# Patient Record
Sex: Female | Born: 1995 | Race: White | Hispanic: No | Marital: Single | Smoking: Never smoker
Health system: Southern US, Community
[De-identification: ages and names within clinical notes are randomized; demographics above are authoritative.]

## PROBLEM LIST (undated history)

## (undated) DIAGNOSIS — G43909 Migraine, unspecified, not intractable, without status migrainosus: Secondary | ICD-10-CM

## (undated) HISTORY — PX: WISDOM TOOTH EXTRACTION: SHX21

## (undated) HISTORY — PX: INNER EAR SURGERY: SHX679

## (undated) HISTORY — DX: Migraine, unspecified, not intractable, without status migrainosus: G43.909

---

## 2019-09-16 ENCOUNTER — Other Ambulatory Visit: Payer: Self-pay

## 2019-09-16 DIAGNOSIS — Z20822 Contact with and (suspected) exposure to covid-19: Secondary | ICD-10-CM

## 2019-09-17 LAB — NOVEL CORONAVIRUS, NAA: SARS-CoV-2, NAA: DETECTED — AB

## 2019-09-26 ENCOUNTER — Ambulatory Visit (HOSPITAL_COMMUNITY)
Admission: EM | Admit: 2019-09-26 | Discharge: 2019-09-26 | Disposition: A | Discharge status: Home / Self Care | Payer: BC Managed Care – PPO | Attending: Radiology | Admitting: Radiology

## 2019-09-26 ENCOUNTER — Ambulatory Visit (INDEPENDENT_AMBULATORY_CARE_PROVIDER_SITE_OTHER): Payer: BC Managed Care – PPO

## 2019-09-26 ENCOUNTER — Encounter (HOSPITAL_COMMUNITY): Payer: Self-pay

## 2019-09-26 ENCOUNTER — Other Ambulatory Visit: Payer: Self-pay

## 2019-09-26 DIAGNOSIS — M791 Myalgia, unspecified site: Secondary | ICD-10-CM

## 2019-09-26 MED ORDER — NAPROXEN 500 MG PO TABS: 500.0000 mg | ORAL_TABLET | Freq: Two times a day (BID) | ORAL | 0 refills | Status: AC

## 2019-09-26 NOTE — ED Triage Notes (Signed)
Pt states she tested positive for Covid 09/13/19 . Pt states she has a tightness in her chest x 2 days.

## 2019-09-26 NOTE — ED Provider Notes (Signed)
[facility]    CSN: [id number] Arrival date & time: 09/26/19  1028      History   Chief Complaint Chief Complaint  Patient presents with  . positive Covid    HPI Jamie Cantu is a 23 y.o. female.  presents with upper sternum "tightness"  X 3 days. Condition is acute in nature. Condition is made better by nothign. Condition is made worse by nothing. Patient denies any treatment prior to there arrival at this facility. Patient states that she is tested covid positive on 11.4.20 with body aches, fevers and cough have elf resolved.    HPI  History reviewed. No pertinent past medical history.  There are no active problems to display for this patient.   History reviewed. No pertinent surgical history.  OB History   No obstetric history on file.      Home Medications    Prior to Admission medications   Not on File    Family History Family History  Problem Relation Age of Onset  . Multiple sclerosis Father     Social History Social History   Tobacco Use  . Smoking status: Never Smoker  . Smokeless tobacco: Never Used  Substance Use Topics  . Alcohol use: Never    Frequency: Never  . Drug use: Not on file     Allergies   Sulfa antibiotics   Review of Systems Review of Systems  Constitutional: Negative for chills and fever.  HENT: Negative for ear pain and sore throat.   Eyes: Negative for pain and visual disturbance.  Respiratory: Negative for cough and shortness of breath.        "Tightness"  Cardiovascular: Negative for chest pain and palpitations.  Gastrointestinal: Negative for abdominal pain and vomiting.  Genitourinary: Negative for dysuria and hematuria.  Musculoskeletal: Negative for arthralgias and back pain.  Skin: Negative for color change and rash.  Neurological: Negative for seizures and syncope.  All other systems reviewed and are negative.    Physical Exam Triage Vital Signs ED Triage Vitals [09/26/19 1115]  Enc  Vitals Group     BP 135/80     Pulse Rate 100     Resp 16     Temp 98.6 F (37 C)     Temp Source Oral     SpO2 100 %     Weight 165 lb (74.8 kg)     Height      Head Circumference      Peak Flow      Pain Score 0     Pain Loc      Pain Edu?      Excl. in GC?    No data found.  Updated Vital Signs BP 135/80 (BP Location: Right Arm)   Pulse 100   Temp 98.6 F (37 C) (Oral)   Resp 16   Wt 165 lb (74.8 kg)   LMP 09/18/2019   SpO2 100%    Physical Exam   UC Treatments / Results  Labs (all labs ordered are listed, but only abnormal results are displayed) Labs Reviewed - No data to display  EKG   Radiology No results found.  Procedures Procedures (including critical care time)  Medications Ordered in UC Medications - No data to display  Initial Impression / Assessment and Plan / UC Course  I have reviewed the triage vital signs and the nursing notes.  Pertinent labs & imaging results that were available during my care of the patient were reviewed by  me and considered in my medical decision making (see chart for details).      Final Clinical Impressions(s) / UC Diagnoses   Final diagnoses:  None   Discharge Instructions   None    ED Prescriptions    None     PDMP not reviewed this encounter.   Jacqualine Mau, NP 09/26/19 1323

## 2020-01-09 ENCOUNTER — Ambulatory Visit: Payer: BC Managed Care – PPO | Attending: Internal Medicine

## 2020-01-09 DIAGNOSIS — Z23 Encounter for immunization: Secondary | ICD-10-CM | POA: Insufficient documentation

## 2020-01-09 NOTE — Progress Notes (Signed)
   Covid-19 Vaccination Clinic  Name:  Serenitie Vinton    MRN: [id number] DOB: May 09, 1996  01/09/2020  Ms. Manukyan was observed post Covid-19 immunization for 15 minutes without incidence. She was provided with Vaccine Information Sheet and instruction to access the V-Safe system.   Ms. Plake was instructed to call 911 with any severe reactions post vaccine: Marland Kitchen Difficulty breathing  . Swelling of your face and throat  . A fast heartbeat  . A bad rash all over your body  . Dizziness and weakness    Immunizations Administered    Name Date Dose VIS Date Route   Pfizer COVID-19 Vaccine 01/09/2020  5:14 PM 0.3 mL 10/23/2019 Intramuscular   Manufacturer: [employer], [employer]   Lot: [id number]   NDC: [address]-0

## 2020-01-30 ENCOUNTER — Ambulatory Visit: Payer: BC Managed Care – PPO | Attending: Internal Medicine

## 2020-01-30 DIAGNOSIS — Z23 Encounter for immunization: Secondary | ICD-10-CM

## 2020-01-30 NOTE — Progress Notes (Signed)
   Covid-19 Vaccination Clinic  Name:  Jamie Cantu    MRN: [id number] DOB: 04-21-1996  01/30/2020  Jamie Cantu was observed post Covid-19 immunization for 15 minutes without incident. She was provided with Vaccine Information Sheet and instruction to access the V-Safe system.   Jamie Cantu was instructed to call 911 with any severe reactions post vaccine: Marland Kitchen Difficulty breathing  . Swelling of face and throat  . A fast heartbeat  . A bad rash all over body  . Dizziness and weakness   Immunizations Administered    Name Date Dose VIS Date Route   Pfizer COVID-19 Vaccine 01/30/2020  9:14 AM 0.3 mL 10/23/2019 Intramuscular   Manufacturer: [employer], [employer]   Lot: [id number]   NDC: [address]-1

## 2021-04-26 IMAGING — DX DG CHEST 2V
2 series · 2 of 2 positions shown · non-contrast
Comparison: None.

CLINICAL DATA: Chest tightness.  Recent positive test for 8STH2-F1.

EXAM:
CHEST - 2 VIEW

[chest pa]
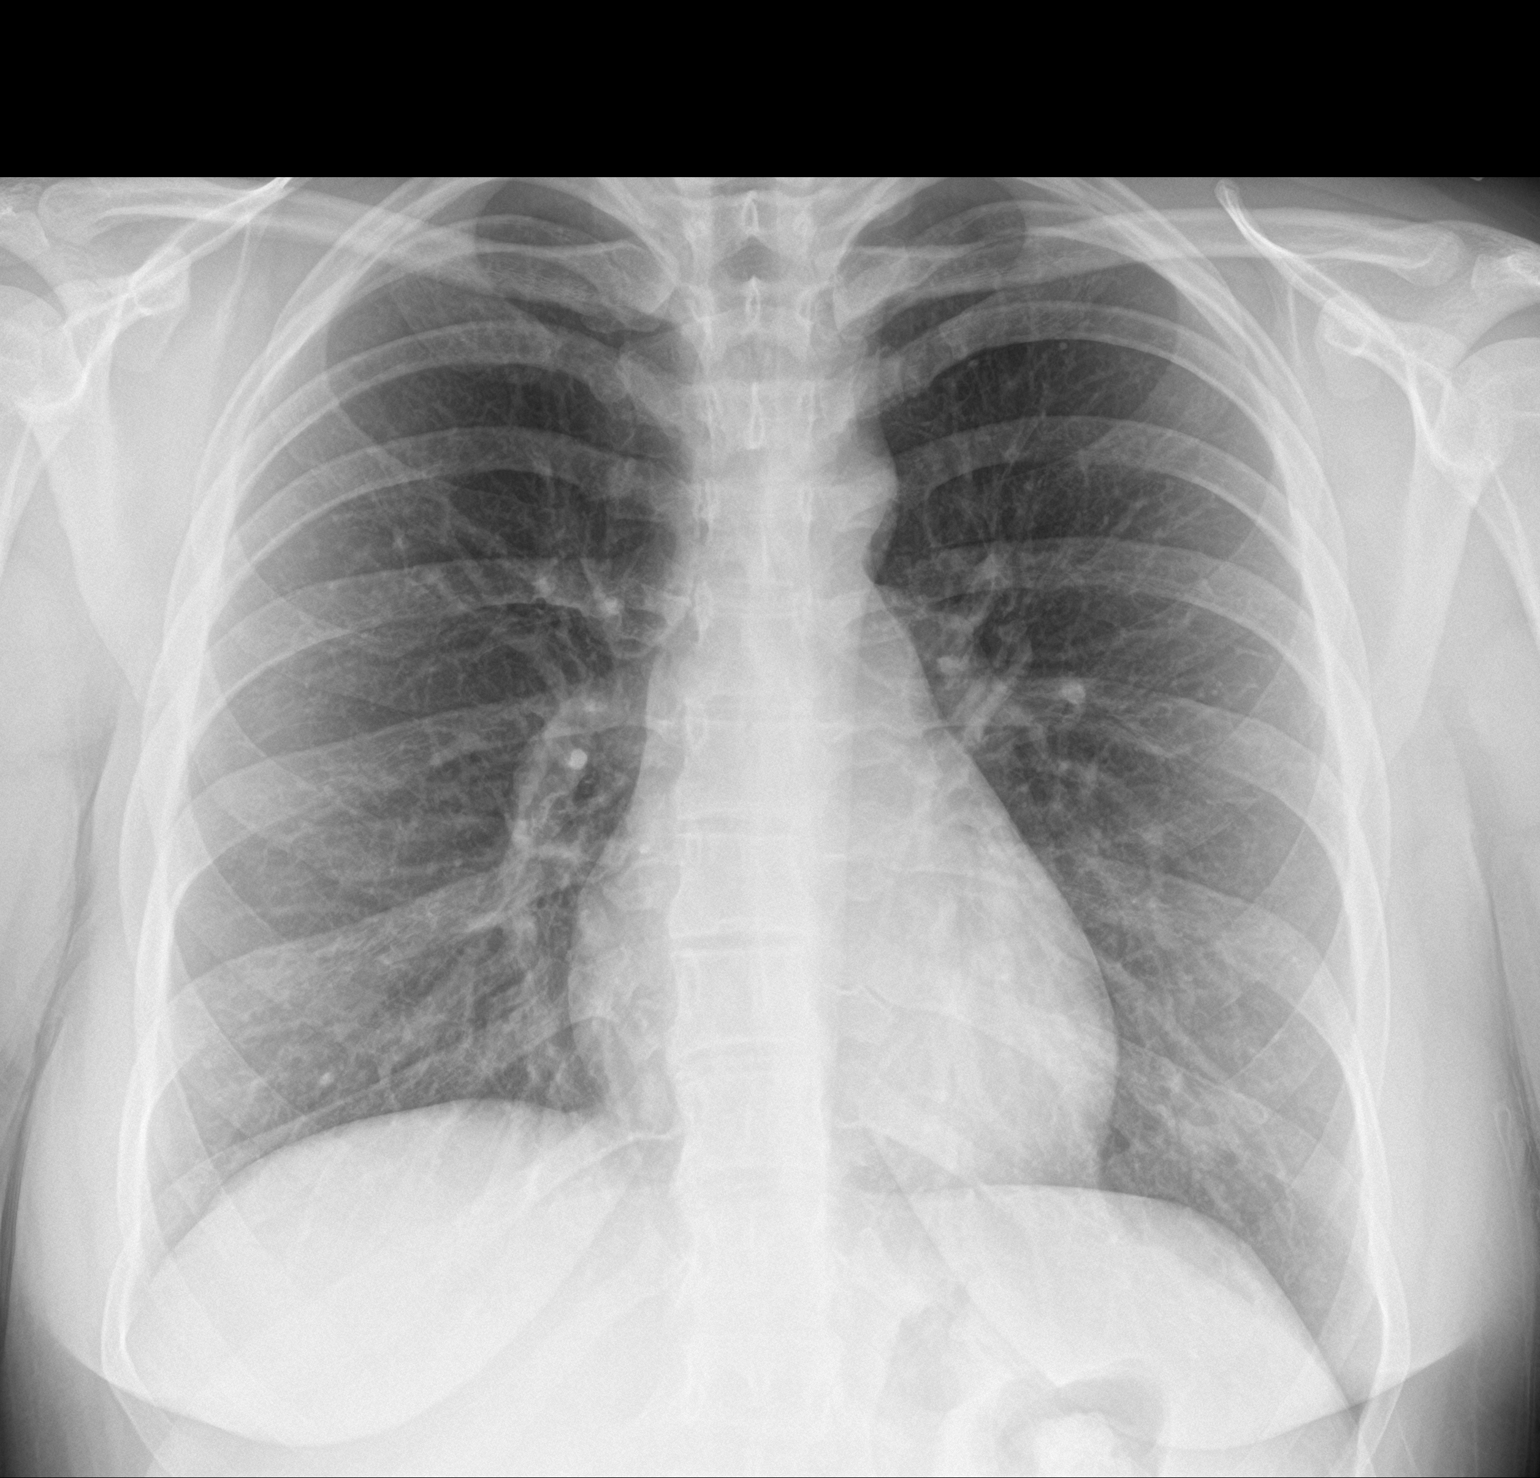

[chest lat]
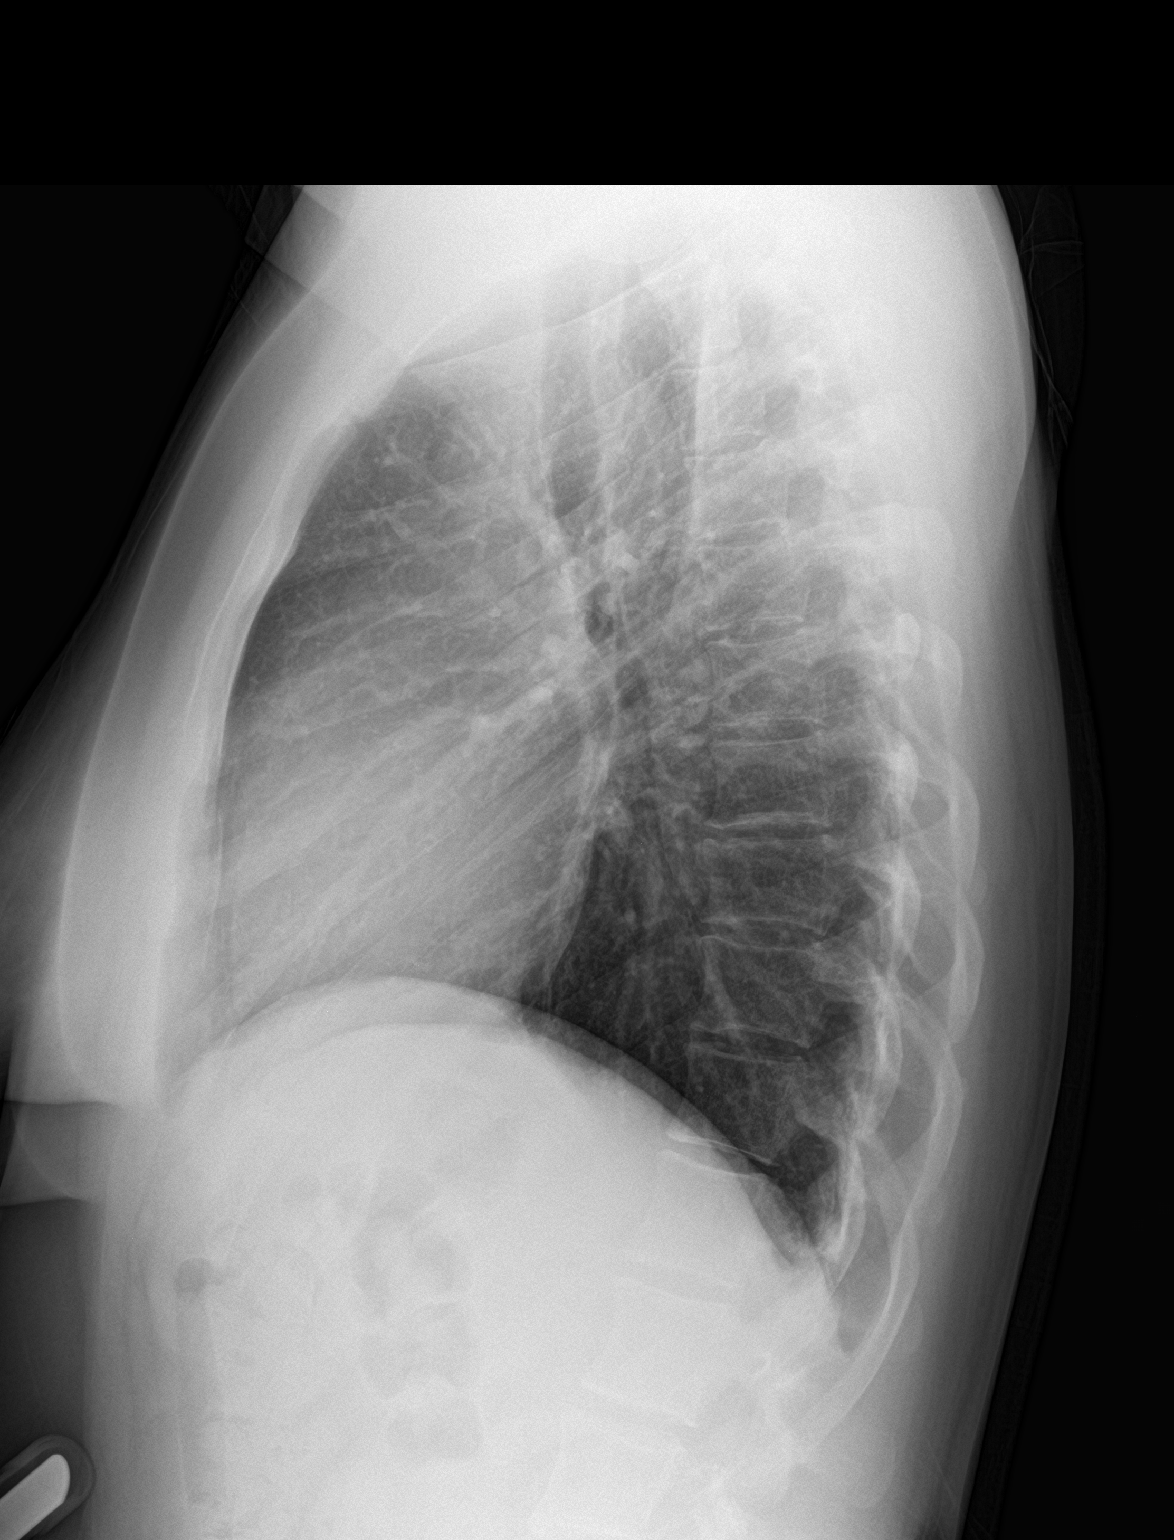

[2 of 2 positions shown; findings below may reference images not displayed]

FINDINGS: The heart size and mediastinal contours are within normal limits.
There is no evidence of pulmonary edema, consolidation,
pneumothorax, nodule or pleural fluid. The visualized skeletal
structures are unremarkable.
IMPRESSION: No active cardiopulmonary disease.

## 2022-08-01 ENCOUNTER — Encounter (HOSPITAL_BASED_OUTPATIENT_CLINIC_OR_DEPARTMENT_OTHER): Payer: Self-pay | Admitting: Emergency Medicine

## 2022-08-01 ENCOUNTER — Emergency Department (HOSPITAL_BASED_OUTPATIENT_CLINIC_OR_DEPARTMENT_OTHER)
Admission: EM | Admit: 2022-08-01 | Discharge: 2022-08-01 | Disposition: A | Discharge status: Home / Self Care | Payer: BC Managed Care – PPO | Attending: Emergency Medicine | Admitting: Emergency Medicine

## 2022-08-01 ENCOUNTER — Other Ambulatory Visit: Payer: Self-pay

## 2022-08-01 DIAGNOSIS — S0990XA Unspecified injury of head, initial encounter: Secondary | ICD-10-CM | POA: Insufficient documentation

## 2022-08-01 DIAGNOSIS — W228XXA Striking against or struck by other objects, initial encounter: Secondary | ICD-10-CM | POA: Diagnosis not present

## 2022-08-01 DIAGNOSIS — S098XXA Other specified injuries of head, initial encounter: Secondary | ICD-10-CM

## 2022-08-01 MED ORDER — ONDANSETRON HCL 4 MG PO TABS
4.0000 mg | ORAL_TABLET | Freq: Once | ORAL | Status: AC
  Administered 2022-08-01: 4 mg via ORAL
  Filled 2022-08-01: qty 1

## 2022-08-01 MED ORDER — IBUPROFEN 800 MG PO TABS
800.0000 mg | ORAL_TABLET | Freq: Once | ORAL | Status: AC
  Administered 2022-08-01: 800 mg via ORAL
  Filled 2022-08-01: qty 1

## 2022-08-01 NOTE — ED Provider Notes (Signed)
[facility] DEPT Provider Note   CSN: [id number] Arrival date & time: 08/01/22  1418     History  Chief Complaint  Patient presents with   Head Injury    Jamie Cantu is a 26 y.o. female.  Pt is a 26 yo female presenting for head injury. Pt states 2 days ago, on Monday, she was leaving back on her bed when she hit her head on the wall behind her bed. Denies any LOC or blood thinner use. Patient states that in the middle of the night she woke up and went to itch her eye when she punched herself in the face. She otherwise admits to dizziness described as light headedness, nausea, and headache. No vomiting. No fevers, illness, or recent URI. No vision changes.   The history is provided by the patient. No language interpreter was used.  Head Injury Associated symptoms: headache and nausea   Associated symptoms: no seizures and no vomiting        Home Medications Prior to Admission medications   Not on File      Allergies    Sulfa antibiotics    Review of Systems   Review of Systems  Constitutional:  Negative for chills and fever.  HENT:  Negative for ear pain and sore throat.   Eyes:  Negative for pain and visual disturbance.  Respiratory:  Negative for cough and shortness of breath.   Cardiovascular:  Negative for chest pain and palpitations.  Gastrointestinal:  Positive for nausea. Negative for abdominal pain and vomiting.  Genitourinary:  Negative for dysuria and hematuria.  Musculoskeletal:  Negative for arthralgias and back pain.  Skin:  Negative for color change and rash.  Neurological:  Positive for light-headedness and headaches. Negative for seizures and syncope.  All other systems reviewed and are negative.   Physical Exam Updated Vital Signs BP 139/86 (BP Location: Right Arm)   Pulse 60   Temp (!) 97.5 F (36.4 C)   Resp 18   Ht 5\' 4"  (1.626 m)   SpO2 100%   BMI 28.32 kg/m  Physical Exam Vitals and nursing note reviewed.   Constitutional:      General: She is not in acute distress.    Appearance: She is well-developed.  HENT:     Head: Normocephalic and atraumatic.  Eyes:     General: Lids are normal. Vision grossly intact.     Conjunctiva/sclera: Conjunctivae normal.     Pupils: Pupils are equal, round, and reactive to light.  Cardiovascular:     Rate and Rhythm: Normal rate and regular rhythm.     Heart sounds: No murmur heard. Pulmonary:     Effort: Pulmonary effort is normal. No respiratory distress.     Breath sounds: Normal breath sounds.  Abdominal:     Palpations: Abdomen is soft.     Tenderness: There is no abdominal tenderness.  Musculoskeletal:        General: No swelling.     Cervical back: Neck supple.  Skin:    General: Skin is warm and dry.     Capillary Refill: Capillary refill takes less than 2 seconds.  Neurological:     Mental Status: She is alert and oriented to person, place, and time.     GCS: GCS eye subscore is 4. GCS verbal subscore is 5. GCS motor subscore is 6.     Cranial Nerves: Cranial nerves 2-12 are intact.     Sensory: Sensation is intact.  Motor: Motor function is intact.     Coordination: Coordination is intact.     Gait: Gait is intact.  Psychiatric:        Mood and Affect: Mood normal.     ED Results / Procedures / Treatments   Labs (all labs ordered are listed, but only abnormal results are displayed) Labs Reviewed - No data to display  EKG None  Radiology No results found.  Procedures Procedures    Medications Ordered in ED Medications  ibuprofen (ADVIL) tablet 800 mg (has no administration in time range)  ondansetron (ZOFRAN) tablet 4 mg (has no administration in time range)    ED Course/ Medical Decision Making/ A&P                           Medical Decision Making Risk Prescription drug management.   3:42 PM  26 yo female presenting for head injury.  Patient is alert and oriented x3, no acute distress, afebrile, stable  vital signs.  Patient has no neurovascular deficits on exam.  Head trauma was minor.  Doubt concussion.  CT Canadian blunt head trauma rule negative.  No further imaging ordered.  Midline spinal tenderness.  Gross vision intact, PERRLA, extraocular eye motions normal.  No wounds to the back of the scalp, lacerations, abrasions, or hematomas.  No midline spinal tenderness.  Motrin given.  Patient in no distress and overall condition improved here in the ED. Detailed discussions were had with the patient regarding current findings, and need for close f/u with PCP or on call doctor. The patient has been instructed to return immediately if the symptoms worsen in any way for re-evaluation. Patient verbalized understanding and is in agreement with current care plan. All questions answered prior to discharge.         Final Clinical Impression(s) / ED Diagnoses Final diagnoses:  Blunt head trauma, initial encounter    Rx / DC Orders ED Discharge Orders     None         Franne Forts, DO 08/01/22 1557

## 2022-08-01 NOTE — ED Triage Notes (Signed)
Pt arrives to ED with c/o head injury. She reports this week she hit her head on a wall and accidentally hit herself in the face. She notes to be dizzy most of the day today. Associated symptoms include nausea, headache.

## 2023-08-03 LAB — AMB RESULTS CONSOLE CBG: Glucose: 146

## 2024-01-13 ENCOUNTER — Other Ambulatory Visit (HOSPITAL_COMMUNITY)
Admission: RE | Admit: 2024-01-13 | Discharge: 2024-01-13 | Disposition: A | Discharge status: Home / Self Care | Source: Ambulatory Visit | Attending: Advanced Practice Midwife | Admitting: Advanced Practice Midwife

## 2024-01-13 ENCOUNTER — Encounter: Payer: Self-pay | Admitting: Advanced Practice Midwife

## 2024-01-13 ENCOUNTER — Ambulatory Visit (INDEPENDENT_AMBULATORY_CARE_PROVIDER_SITE_OTHER): Payer: 59 | Admitting: Advanced Practice Midwife

## 2024-01-13 VITALS — BP 128/88 | HR 71 | Ht 64.0 in | Wt 195.0 lb

## 2024-01-13 DIAGNOSIS — Z124 Encounter for screening for malignant neoplasm of cervix: Secondary | ICD-10-CM | POA: Insufficient documentation

## 2024-01-13 DIAGNOSIS — Z30011 Encounter for initial prescription of contraceptive pills: Secondary | ICD-10-CM

## 2024-01-13 DIAGNOSIS — Z01419 Encounter for gynecological examination (general) (routine) without abnormal findings: Secondary | ICD-10-CM | POA: Insufficient documentation

## 2024-01-13 DIAGNOSIS — Z1151 Encounter for screening for human papillomavirus (HPV): Secondary | ICD-10-CM | POA: Insufficient documentation

## 2024-01-13 MED ORDER — NORETHIN-ETH ESTRAD-FE BIPHAS 1 MG-10 MCG / 10 MCG PO TABS: 1.0000 | ORAL_TABLET | Freq: Every day | ORAL | 4 refills | Status: AC

## 2024-01-13 NOTE — Progress Notes (Signed)
 Jamie Cantu 27 y.o.  Vitals:   01/13/24 1602 01/13/24 1610  BP: 138/89 128/88  Pulse: 85 71     Filed Weights   01/13/24 1602  Weight: 195 lb (88.5 kg)    Past Medical History: Past Medical History:  Diagnosis Date   Migraines    Vaginal Pap smear, abnormal     Past Surgical History: Past Surgical History:  Procedure Laterality Date   INNER EAR SURGERY     WISDOM TOOTH EXTRACTION      Family History: Family History  Problem Relation Age of Onset   Dementia Maternal Grandfather    Multiple sclerosis Father     Social History: Social History   Tobacco Use   Smoking status: Never   Smokeless tobacco: Never  Vaping Use   Vaping status: Never Used  Substance Use Topics   Alcohol use: Never   Drug use: Never    Allergies:  Allergies  Allergen Reactions   Sulfa Antibiotics Rash     No current outpatient medications on file.  History of Present Illness: Here for pap and physical.  Last pap 3 years ago, normal.  Wants COCs, teaches art at [employer]  Prior cytology:  Normal per pt   Review of Systems   Patient denies any headaches, blurred vision, shortness of breath, chest pain, abdominal pain, problems with bowel movements, urination, or intercourse.   Physical Exam: General:  Well developed, well nourished, no acute distress Skin:  Warm and dry Lungs; normal respiratory effort Breast:  No dominant palpable mass, retraction, or nipple discharge Cardiovascular: Regular rate and rhythm Abdomen:  Soft, non tender, no hepatosplenomegaly Pelvic:  External genitalia is normal in appearance.  The vagina is normal in appearance.  The cervix is bulbous.  Uterus is felt to be normal size, shape, and contour.  No adnexal masses or tenderness noted. Exam limited by habitus  Extremities:  No swelling or varicosities noted Psych:  No mood changes.     Impression: Normal GYN exam      Plan: If pap normal, repeat in 3 years  Start Loloestrin, B/U for 2  weeks  F/u 3 months for med check

## 2024-01-13 NOTE — Addendum Note (Signed)
 Addended by: Moss Mc on: 01/13/2024 04:51 PM   Modules accepted: Orders

## 2024-01-16 LAB — CYTOLOGY - PAP
Adequacy: ABSENT
Comment: NEGATIVE
Diagnosis: NEGATIVE
High risk HPV: NEGATIVE

## 2024-02-07 ENCOUNTER — Ambulatory Visit: Payer: Self-pay | Admitting: Physician Assistant

## 2024-02-07 VITALS — BP 138/86 | HR 81 | Temp 98.1°F | Ht 64.0 in | Wt 197.0 lb

## 2024-02-07 DIAGNOSIS — R030 Elevated blood-pressure reading, without diagnosis of hypertension: Secondary | ICD-10-CM

## 2024-02-07 DIAGNOSIS — Z6833 Body mass index (BMI) 33.0-33.9, adult: Secondary | ICD-10-CM | POA: Insufficient documentation

## 2024-02-07 DIAGNOSIS — H6993 Unspecified Eustachian tube disorder, bilateral: Secondary | ICD-10-CM | POA: Diagnosis not present

## 2024-02-07 DIAGNOSIS — Z7689 Persons encountering health services in other specified circumstances: Secondary | ICD-10-CM

## 2024-02-07 NOTE — Assessment & Plan Note (Signed)
 Stable, likely related to anxiety in office. Will continue to follow blood pressure trends and treat as indicated. Warning signs reviewed. Patient to follow up in 1 year or sooner as needed.

## 2024-02-07 NOTE — Progress Notes (Signed)
 New Patient Office Visit  Subjective    Patient ID: Jamie Cantu, female    DOB: 1996-10-20  Age: 28 y.o. MRN: [id number]  CC:  Chief Complaint  Patient presents with   Establish Care    HPI Jamie Cantu presents to establish care  Patient presents today with past medical history significant for migraines and obesity. She brings lab results from work with her to include lipid panel and A1c, all within normal limits. She follows with [facility] for OBGYN care. Recent pap 01/13/24. She has no major concerns or complaints today, only mentioning ears feeling full due to weather changes.   Outpatient Encounter Medications as of 02/07/2024  Medication Sig   Norethindrone-Ethinyl Estradiol-Fe Biphas (LO LOESTRIN FE) 1 MG-10 MCG / 10 MCG tablet Take 1 tablet by mouth daily.   No facility-administered encounter medications on file as of 02/07/2024.    Past Medical History:  Diagnosis Date   Migraines    no aura    Past Surgical History:  Procedure Laterality Date   INNER EAR SURGERY     WISDOM TOOTH EXTRACTION      Family History  Problem Relation Age of Onset   Dementia Maternal Grandfather    Multiple sclerosis Father     Social History   Socioeconomic History   Marital status: Single    Spouse name: Not on file   Number of children: 0   Years of education: Not on file   Highest education level: Master's degree (e.g., MA, MS, MEng, MEd, MSW, MBA)  Occupational History   Not on file  Tobacco Use   Smoking status: Never   Smokeless tobacco: Never  Vaping Use   Vaping status: Never Used  Substance and Sexual Activity   Alcohol use: Never   Drug use: Never   Sexual activity: Yes    Birth control/protection: Pill  Other Topics Concern   Not on file  Social History Narrative   Not on file   Social Drivers of Health   Financial Resource Strain: Low Risk  (02/07/2024)   Overall Financial Resource Strain (CARDIA)    Difficulty of Paying Living Expenses: Not  hard at all  Food Insecurity: No Food Insecurity (02/07/2024)   Hunger Vital Sign    Worried About Running Out of Food in the Last Year: Never true    Ran Out of Food in the Last Year: Never true  Transportation Needs: No Transportation Needs (02/07/2024)   PRAPARE - Administrator, Civil Service (Medical): No    Lack of Transportation (Non-Medical): No  Physical Activity: Insufficiently Active (02/07/2024)   Exercise Vital Sign    Days of Exercise per Week: 1 day    Minutes of Exercise per Session: 40 min  Stress: No Stress Concern Present (02/07/2024)   [employer] of Occupational Health - Occupational Stress Questionnaire    Feeling of Stress : Only a little  Social Connections: Moderately Isolated (02/07/2024)   Social Connection and Isolation Panel [NHANES]    Frequency of Communication with Friends and Family: Once a week    Frequency of Social Gatherings with Friends and Family: Once a week    Attends Religious Services: More than 4 times per year    Active Member of [employer] or Organizations: Yes    Attends Banker Meetings: More than 4 times per year    Marital Status: Never married  Intimate Partner Violence: Not At Risk (01/13/2024)   Humiliation, Afraid, Rape, and Kick  questionnaire    Fear of Current or Ex-Partner: No    Emotionally Abused: No    Physically Abused: No    Sexually Abused: No    Review of Systems  Constitutional:  Negative for fever, malaise/fatigue and weight loss.  HENT:  Negative for ear discharge and ear pain.   Respiratory:  Negative for shortness of breath.   Cardiovascular:  Negative for chest pain.  Gastrointestinal:  Negative for nausea and vomiting.  Neurological:  Negative for headaches.        Objective    BP 138/86   Pulse 81   Temp 98.1 F (36.7 C)   Ht 5\' 4"  (1.626 m)   Wt 197 lb (89.4 kg)   LMP 01/06/2024 (Exact Date)   SpO2 99%   BMI 33.81 kg/m   Physical Exam Constitutional:      Appearance:  Normal appearance.  HENT:     Head: Normocephalic.     Mouth/Throat:     Mouth: Mucous membranes are moist.     Pharynx: Oropharynx is clear.  Eyes:     Extraocular Movements: Extraocular movements intact.     Conjunctiva/sclera: Conjunctivae normal.  Cardiovascular:     Rate and Rhythm: Normal rate and regular rhythm.     Heart sounds: Normal heart sounds. No murmur heard. Pulmonary:     Effort: Pulmonary effort is normal.     Breath sounds: Normal breath sounds.  Skin:    General: Skin is warm and dry.  Neurological:     General: No focal deficit present.     Mental Status: She is alert and oriented to person, place, and time.  Psychiatric:        Mood and Affect: Mood normal.        Behavior: Behavior normal.       Assessment & Plan:  Encounter to establish care  Elevated blood pressure reading in office with white coat syndrome, without diagnosis of hypertension Assessment & Plan: Stable, likely related to anxiety in office. Will continue to follow blood pressure trends and treat as indicated. Warning signs reviewed. Patient to follow up in 1 year or sooner as needed.    Dysfunction of both eustachian tubes Assessment & Plan: Stable. Reports distant history of ear disorders as child. Advised Flonase for ear pressure.    BMI 33.0-33.9,adult    Return in about 1 year (around 02/06/2025), or as needed.   Jamie Amend Basem Yannuzzi, PA-C

## 2024-02-07 NOTE — Assessment & Plan Note (Signed)
 Stable. Reports distant history of ear disorders as child. Advised Flonase for ear pressure.

## 2024-03-07 ENCOUNTER — Telehealth: Admitting: Physician Assistant

## 2024-03-07 DIAGNOSIS — J302 Other seasonal allergic rhinitis: Secondary | ICD-10-CM | POA: Diagnosis not present

## 2024-03-07 MED ORDER — FLUTICASONE PROPIONATE 50 MCG/ACT NA SUSP: 2.0000 | Freq: Every day | NASAL | 6 refills | Status: DC

## 2024-03-07 MED ORDER — LEVOCETIRIZINE DIHYDROCHLORIDE 5 MG PO TABS: 5.0000 mg | ORAL_TABLET | Freq: Every evening | ORAL | 0 refills | Status: DC

## 2024-03-07 NOTE — Progress Notes (Signed)
 I have spent 5 minutes in review of e-visit questionnaire, review and updating patient chart, medical decision making and response to patient.   Laure Kidney, PA-C

## 2024-03-07 NOTE — Progress Notes (Signed)
 I have spent 5 minutes in review of e-visit questionnaire, review and updating patient chart, medical decision making and response to patient.   Marciana Settle, PA-C  E visit for Allergic Rhinitis We are sorry that you are not feeling well.  Here is how we plan to help!  Based on what you have shared with me it looks like you have Allergic Rhinitis.  Rhinitis is when a reaction occurs that causes nasal congestion, runny nose, sneezing, and itching.  Most types of rhinitis are caused by an inflammation and are associated with symptoms in the eyes ears or throat. There are several types of rhinitis.  The most common are acute rhinitis, which is usually caused by a viral illness, allergic or seasonal rhinitis, and nonallergic or year-round rhinitis.  Nasal allergies occur certain times of the year.  Allergic rhinitis is caused when allergens in the air trigger the release of histamine in the body.  Histamine causes itching, swelling, and fluid to build up in the fragile linings of the nasal passages, sinuses and eyelids.  An itchy nose and clear discharge are common.  I recommend the following over the counter treatments: Xyzal 5 mg take 1 tablet daily  I also would recommend a nasal spray: Flonase 2 sprays into each nostril once daily  You may also benefit from eye drops such as: Visine 1-2 drops each eye twice daily as needed  HOME CARE:  You can use an over-the-counter saline nasal spray as needed Avoid areas where there is heavy dust, mites, or molds Stay indoors on windy days during the pollen season Keep windows closed in home, at least in bedroom; use air conditioner. Use high-efficiency house air filter Keep windows closed in car, turn AC on re-circulate Avoid playing out with dog during pollen season  GET HELP RIGHT AWAY IF:  If your symptoms do not improve within 10 days You become short of breath You develop yellow or green discharge from your nose for over 3 days You have  coughing fits  MAKE SURE YOU:  Understand these instructions Will watch your condition Will get help right away if you are not doing well or get worse  Thank you for choosing an e-visit. Your e-visit answers were reviewed by a board certified advanced clinical practitioner to complete your personal care plan. Depending upon the condition, your plan could have included both over the counter or prescription medications. Please review your pharmacy choice. Be sure that the pharmacy you have chosen is open so that you can pick up your prescription now.  If there is a problem you may message your provider in MyChart to have the prescription routed to another pharmacy. Your safety is important to us . If you have drug allergies check your prescription carefully.  For the next 24 hours, you can use MyChart to ask questions about today's visit, request a non-urgent call back, or ask for a work or school excuse from your e-visit provider. You will get an email in the next two days asking about your experience. I hope that your e-visit has been valuable and will speed your recovery.

## 2024-03-09 ENCOUNTER — Ambulatory Visit: Admitting: Physician Assistant

## 2024-03-09 ENCOUNTER — Encounter: Payer: Self-pay | Admitting: Physician Assistant

## 2024-03-09 VITALS — BP 125/82 | HR 76 | Temp 98.2°F | Ht 64.0 in | Wt 198.0 lb

## 2024-03-09 DIAGNOSIS — J069 Acute upper respiratory infection, unspecified: Secondary | ICD-10-CM

## 2024-03-09 DIAGNOSIS — B9689 Other specified bacterial agents as the cause of diseases classified elsewhere: Secondary | ICD-10-CM | POA: Diagnosis not present

## 2024-03-09 MED ORDER — AZITHROMYCIN 250 MG PO TABS: ORAL_TABLET | ORAL | 0 refills | Status: AC

## 2024-03-09 NOTE — Progress Notes (Signed)
 Acute Office Visit  Subjective:     Patient ID: Jamie Cantu, female    DOB: [birth year], 28 y.o.   MRN: [id number]   Patient presents today with concerns for congestion and rhinorrhea. She reports symptoms began last Wednesday. Associated symptoms include headache, sinus pressure, watery eyes, sputum production, and fatigue. She denies fevers, chest pain, or shortness of breath. She has been taking Claritin, Flonase, and Mucinex with minimal relief. She denies sick contacts but does work as a Runner, broadcasting/film/video.      Review of Systems  Constitutional:  Positive for malaise/fatigue. Negative for chills and fever.  HENT:  Positive for congestion, sinus pain and sore throat. Negative for ear discharge and ear pain.   Eyes:  Positive for discharge and redness.  Respiratory:  Positive for cough and sputum production. Negative for shortness of breath and wheezing.   Cardiovascular:  Negative for chest pain and palpitations.  Musculoskeletal:  Negative for joint pain and myalgias.  Neurological:  Positive for headaches.        Objective:     BP 125/82   Pulse 76   Temp 98.2 F (36.8 C)   Ht 5\' 4"  (1.626 m)   Wt 198 lb (89.8 kg)   SpO2 98%   BMI 33.99 kg/m   Physical Exam Vitals reviewed.  Constitutional:      General: She is not in acute distress.    Appearance: Normal appearance.  HENT:     Right Ear: Tympanic membrane normal.     Left Ear: Tympanic membrane normal.     Nose: Congestion and rhinorrhea present.     Mouth/Throat:     Mouth: Mucous membranes are moist.     Pharynx: Oropharynx is clear. Posterior oropharyngeal erythema present.  Eyes:     Extraocular Movements: Extraocular movements intact.     Conjunctiva/sclera:     Right eye: Right conjunctiva is injected.     Left eye: Left conjunctiva is injected.  Cardiovascular:     Rate and Rhythm: Normal rate and regular rhythm.     Heart sounds: No murmur heard. Pulmonary:     Effort: Pulmonary effort is normal.      Breath sounds: Normal breath sounds. No wheezing.  Skin:    General: Skin is warm and dry.  Neurological:     General: No focal deficit present.     Mental Status: She is alert and oriented to person, place, and time.  Psychiatric:        Mood and Affect: Mood normal.        Behavior: Behavior normal.     No results found for any visits on 03/09/24.      Assessment & Plan:  Bacterial URI Assessment & Plan: Patient appears stable today. Benign exam, lungs clear to auscultation bilaterally, no indication for chest XR at this time. Discussed that this fits the picture of viral vs bacterial URI and that due to type and duration of symptoms and exam findings, we will treat as bacterial URI. No evidence of other bacterial infections including pneumonia, pharyngitis, sinusitis, or otitis media. Azithromycin x 5 days. Supportive care reviewed with patient. Tylenol or ibuprofen  for pain as needed. Advised continued use with daily antihistamine. May continue with OTC cold medications. Patient instructed to return to clinic if worsening shortness of breath, chest pain, hypoxia, or other concerns. Patient agreeable to plan.    Orders: -     Azithromycin; Take 2 tablets on day 1, then 1 tablet daily  on days 2 through 5  Dispense: 6 tablet; Refill: 0   Return if symptoms worsen or fail to improve.  Jearlean Mince Addysen Louth, PA-C

## 2024-03-09 NOTE — Assessment & Plan Note (Signed)
 Patient appears stable today. Benign exam, lungs clear to auscultation bilaterally, no indication for chest XR at this time. Discussed that this fits the picture of viral vs bacterial URI and that due to type and duration of symptoms and exam findings, we will treat as bacterial URI. No evidence of other bacterial infections including pneumonia, pharyngitis, sinusitis, or otitis media. Azithromycin x 5 days. Supportive care reviewed with patient. Tylenol or ibuprofen  for pain as needed. Advised continued use with daily antihistamine. May continue with OTC cold medications. Patient instructed to return to clinic if worsening shortness of breath, chest pain, hypoxia, or other concerns. Patient agreeable to plan.

## 2024-03-11 ENCOUNTER — Encounter: Payer: Self-pay | Admitting: Physician Assistant

## 2024-03-25 ENCOUNTER — Encounter: Payer: Self-pay | Admitting: Physician Assistant

## 2024-03-25 ENCOUNTER — Ambulatory Visit: Admitting: Physician Assistant

## 2024-03-25 VITALS — BP 114/77 | HR 88 | Temp 98.4°F | Ht 64.0 in | Wt 196.2 lb

## 2024-03-25 DIAGNOSIS — J020 Streptococcal pharyngitis: Secondary | ICD-10-CM | POA: Diagnosis not present

## 2024-03-25 LAB — POCT RAPID STREP A (OFFICE): Rapid Strep A Screen: POSITIVE — AB

## 2024-03-25 MED ORDER — AMOXICILLIN 500 MG PO CAPS: 500.0000 mg | ORAL_CAPSULE | Freq: Two times a day (BID) | ORAL | 0 refills | Status: AC

## 2024-03-25 NOTE — Progress Notes (Signed)
   Acute Office Visit  Subjective:     Patient ID: Jamie Cantu, female    DOB: [birth year], 28 y.o.   MRN: [id number]   Patient presents today with complaints of sore throat. Patient recently recovered from viral illness/sinus infection. She denies associated symptoms today such as congestion, rhinorrhea, fevers, or ear pain. She states throat has been sore for 1 week. She describes feeling like her throat is swollen, and endorses pain with swallowing or certain works when speaking. She denies having symptoms previously.      Review of Systems  Constitutional:  Negative for chills, fever and malaise/fatigue.  HENT:  Positive for sore throat. Negative for congestion, ear pain and sinus pain.   Respiratory:  Negative for cough.   Cardiovascular:  Negative for chest pain.  Musculoskeletal:  Negative for myalgias.  Neurological:  Negative for headaches.        Objective:     BP 114/77   Pulse 88   Temp 98.4 F (36.9 C)   Ht 5\' 4"  (1.626 m)   Wt 196 lb 3.2 oz (89 kg)   SpO2 99%   BMI 33.68 kg/m   Physical Exam Vitals reviewed.  Constitutional:      General: She is not in acute distress.    Appearance: Normal appearance.  HENT:     Nose: Nose normal.     Mouth/Throat:     Mouth: Mucous membranes are moist.     Pharynx: Oropharynx is clear. No posterior oropharyngeal erythema.  Eyes:     Extraocular Movements: Extraocular movements intact.     Conjunctiva/sclera: Conjunctivae normal.  Cardiovascular:     Rate and Rhythm: Normal rate and regular rhythm.     Heart sounds: Normal heart sounds. No murmur heard. Pulmonary:     Effort: Pulmonary effort is normal.     Breath sounds: Normal breath sounds.  Skin:    General: Skin is warm and dry.  Neurological:     General: No focal deficit present.     Mental Status: She is alert and oriented to person, place, and time.  Psychiatric:        Mood and Affect: Mood normal.        Behavior: Behavior normal.      Results for orders placed or performed in visit on 03/25/24  Rapid Strep A  Result Value Ref Range   Rapid Strep A Screen Positive (A) Negative        Assessment & Plan:  Strep pharyngitis Assessment & Plan: Positive rapid strep: Amoxicillin 500mg  BID x 10days Promote hydration.   Analgesia and fever control with acetaminophen, ibuprofen . Follow up if worsening, fevers persist for > 5 days, severe pain with swallowing or unable to swallow.   Orders: -     POCT rapid strep A -     Amoxicillin; Take 1 capsule (500 mg total) by mouth 2 (two) times daily for 10 days.  Dispense: 20 capsule; Refill: 0     No follow-ups on file.  Jearlean Mince Veyda Kaufman, PA-C

## 2024-03-25 NOTE — Assessment & Plan Note (Signed)
 Positive rapid strep: Amoxicillin 500mg  BID x 10days Promote hydration.   Analgesia and fever control with acetaminophen, ibuprofen . Follow up if worsening, fevers persist for > 5 days, severe pain with swallowing or unable to swallow.

## 2024-03-30 ENCOUNTER — Ambulatory Visit: Admitting: Physician Assistant

## 2024-06-22 ENCOUNTER — Telehealth: Admitting: Physician Assistant

## 2024-06-22 DIAGNOSIS — J029 Acute pharyngitis, unspecified: Secondary | ICD-10-CM | POA: Diagnosis not present

## 2024-06-22 MED ORDER — AMOXICILLIN 500 MG PO CAPS: 500.0000 mg | ORAL_CAPSULE | Freq: Two times a day (BID) | ORAL | 0 refills | Status: AC

## 2024-06-22 NOTE — Progress Notes (Signed)
E-Visit for Sore Throat - Strep Symptoms ? ?We are sorry that you are not feeling well.  Here is how we plan to help! ? ?Based on what you have shared with me it is likely that you have strep pharyngitis.  Strep pharyngitis is inflammation and infection in the back of the throat.  This is an infection cause by bacteria and is treated with antibiotics.  I have prescribed Amoxicillin 500 mg twice a day for 10 days. For throat pain, we recommend over the counter oral pain relief medications such as acetaminophen or aspirin, or anti-inflammatory medications such as ibuprofen or naproxen sodium. Topical treatments such as oral throat lozenges or sprays may be used as needed. Strep infections are not as easily transmitted as other respiratory infections, however we still recommend that you avoid close contact with loved ones, especially the very young and elderly.  Remember to wash your hands thoroughly throughout the day as this is the number one way to prevent the spread of infection and wipe down door knobs and counters with disinfectant. ? ? ?Home Care: ?Only take medications as instructed by your medical team. ?Complete the entire course of an antibiotic. ?Do not take these medications with alcohol. ?A steam or ultrasonic humidifier can help congestion.  You can place a towel over your head and breathe in the steam from hot water coming from a faucet. ?Avoid close contacts especially the very young and the elderly. ?Cover your mouth when you cough or sneeze. ?Always remember to wash your hands. ? ?Get Help Right Away If: ?You develop worsening fever or sinus pain. ?You develop a severe head ache or visual changes. ?Your symptoms persist after you have completed your treatment plan. ? ?Make sure you ?Understand these instructions. ?Will watch your condition. ?Will get help right away if you are not doing well or get worse. ? ? ?Thank you for choosing an e-visit. ? ?Your e-visit answers were reviewed by a board  certified advanced clinical practitioner to complete your personal care plan. Depending upon the condition, your plan could have included both over the counter or prescription medications. ? ?Please review your pharmacy choice. Make sure the pharmacy is open so you can pick up prescription now. If there is a problem, you may contact your provider through MyChart messaging and have the prescription routed to another pharmacy.  Your safety is important to us. If you have drug allergies check your prescription carefully.  ? ?For the next 24 hours you can use MyChart to ask questions about today's visit, request a non-urgent call back, or ask for a work or school excuse. ?You will get an email in the next two days asking about your experience. I hope that your e-visit has been valuable and will speed your recovery. ? ?Approximately 5 minutes was spent documenting and reviewing patient's chart.  ? ? ?

## 2024-10-14 ENCOUNTER — Telehealth: Admitting: Physician Assistant

## 2024-10-14 DIAGNOSIS — J029 Acute pharyngitis, unspecified: Secondary | ICD-10-CM

## 2024-10-14 DIAGNOSIS — J020 Streptococcal pharyngitis: Secondary | ICD-10-CM

## 2024-10-14 NOTE — Progress Notes (Signed)

## 2024-10-19 ENCOUNTER — Encounter: Payer: Self-pay | Admitting: Nurse Practitioner

## 2024-10-19 MED ORDER — PENICILLIN V POTASSIUM 500 MG PO TABS: 500.0000 mg | ORAL_TABLET | Freq: Three times a day (TID) | ORAL | 0 refills | Status: AC

## 2024-10-19 NOTE — Addendum Note (Signed)
 Addended by: KENNYTH DOMINO E on: 10/19/2024 10:34 AM   Modules accepted: Orders
